# Patient Record
Sex: Female | Born: 1965 | Race: Asian | Hispanic: Yes | Marital: Single | Smoking: Never smoker
Health system: Southern US, Community
[De-identification: ages and names within clinical notes are randomized; demographics above are authoritative.]

---

## 2015-11-03 DIAGNOSIS — Z8639 Personal history of other endocrine, nutritional and metabolic disease: Secondary | ICD-10-CM | POA: Insufficient documentation

## 2015-11-03 DIAGNOSIS — Y998 Other external cause status: Secondary | ICD-10-CM | POA: Insufficient documentation

## 2015-11-03 DIAGNOSIS — Y92410 Unspecified street and highway as the place of occurrence of the external cause: Secondary | ICD-10-CM | POA: Insufficient documentation

## 2015-11-03 DIAGNOSIS — Y9389 Activity, other specified: Secondary | ICD-10-CM | POA: Diagnosis not present

## 2015-11-03 DIAGNOSIS — S161XXA Strain of muscle, fascia and tendon at neck level, initial encounter: Secondary | ICD-10-CM

## 2015-11-03 DIAGNOSIS — S79911A Unspecified injury of right hip, initial encounter: Secondary | ICD-10-CM | POA: Diagnosis not present

## 2015-11-03 DIAGNOSIS — S199XXA Unspecified injury of neck, initial encounter: Secondary | ICD-10-CM | POA: Diagnosis present
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
## 2015-11-03 NOTE — Discharge Instructions (Signed)
If you were given medicines take as directed.  If you are on coumadin or contraceptives realize their levels and effectiveness is altered by many different medicines.  If you have any reaction (rash, tongues swelling, other) to the medicines stop taking and see a physician.   Tylenol every 4 hrs for pain, ibuprofen every 6 hrs as needed for pain.  Flexeril for muscle spasms as needed.  If your blood pressure was elevated in the ER make sure you follow up for management with a primary doctor or return for chest pain, shortness of breath or stroke symptoms.  Please follow up as directed and return to the ER or see a physician for new or worsening symptoms.  Thank you. Filed Vitals:   11/03/15 0708  BP: 131/67  Pulse: 89  Temp: 99 F (37.2 C)  TempSrc: Oral  Resp: 14  SpO2: 99%
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
